# Patient Record
Sex: Male | Born: 1990 | Race: White | Hispanic: No | Marital: Single | Smoking: Current some day smoker
Health system: Southern US, Community
[De-identification: ages and names within clinical notes are randomized; demographics above are authoritative.]

## PROBLEM LIST (undated history)

## (undated) DIAGNOSIS — T7840XA Allergy, unspecified, initial encounter: Secondary | ICD-10-CM

## (undated) DIAGNOSIS — I10 Essential (primary) hypertension: Secondary | ICD-10-CM

## (undated) HISTORY — DX: Allergy, unspecified, initial encounter: T78.40XA

## (undated) HISTORY — DX: Essential (primary) hypertension: I10

---
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
## 2009-11-11 HISTORY — PX: ARTHROSCOPIC REPAIR ACL: SUR80

## 2012-06-27 ENCOUNTER — Ambulatory Visit (INDEPENDENT_AMBULATORY_CARE_PROVIDER_SITE_OTHER): Payer: BC Managed Care – PPO | Admitting: Emergency Medicine

## 2012-06-27 VITALS — BP 132/56 | HR 73 | Temp 98.3°F | Resp 16 | Ht 75.5 in | Wt 255.0 lb

## 2012-06-27 DIAGNOSIS — L508 Other urticaria: Secondary | ICD-10-CM

## 2012-06-27 MED ORDER — FEXOFENADINE HCL 180 MG PO TABS: 180.0000 mg | ORAL_TABLET | Freq: Every day | ORAL | Status: AC

## 2012-06-27 MED ORDER — CIMETIDINE 200 MG PO TABS: 400.0000 mg | ORAL_TABLET | Freq: Four times a day (QID) | ORAL | Status: DC

## 2012-06-27 MED ORDER — METHYLPREDNISOLONE ACETATE 80 MG/ML IJ SUSP
80.0000 mg | Freq: Once | INTRAMUSCULAR | Status: AC
  Administered 2012-06-27: 80 mg via INTRAMUSCULAR

## 2012-06-27 NOTE — Patient Instructions (Signed)
Hives Hives (urticaria) are itchy, red, swollen patches on the skin. They may change size, shape, and location quickly and repeatedly. Hives that occur deeper in the skin can cause swelling of the hands, feet, and face. Hives may be an allergic reaction to something you or your child ate, touched, or put on the skin. Hives can also be a reaction to cold, heat, viral infections, medication, insect bites, or emotional stress. Often the cause is hard to find. Hives can come and go for several days to several weeks. Hives are not contagious. HOME CARE INSTRUCTIONS   If the cause of the hives is known, avoid exposure to that source.   To relieve itching and rash:   Apply cold compresses to the skin or take cool water baths. Do not take or give your child hot baths or showers because the warmth will make the itching worse.   The best medicine for hives is an antihistamine. An antihistamine will not cure hives, but it will reduce their severity. You can use an antihistamine available over the counter. This medicine may make your child sleepy. Teenagers should not drive while using this medicine.   Take or give an antihistamine every 6 hours until the hives are completely gone for 24 hours or as directed.   Your child may have other medications prescribed for itching. Give these as directed by your child's caregiver.   You or your child should wear loose fitting clothing, including undergarments. Skin irritations may make hives worse.   Follow-up as directed by your caregiver.  SEEK MEDICAL CARE IF:   You or your child still have considerable itching after taking the medication (prescribed or purchased over the counter).   Joint swelling or pain occurs.  SEEK IMMEDIATE MEDICAL CARE IF:   You have a fever.   Swollen lips or tongue are noticed.   There is difficulty with breathing, swallowing, or tightness in the throat or chest.   Abdominal pain develops.   Your child starts acting very  sick.  These may be the first signs of a life-threatening allergic reaction. THIS IS AN EMERGENCY. Call 911 for medical help. MAKE SURE YOU:   Understand these instructions.   Will watch your condition.   Will get help right away if you are not doing well or get worse.  Document Released: 11/28/2005 Document Revised: 11/17/2011 Document Reviewed: 07/18/2008 Exit[employer] Patient Information 2012 [employer]. 

## 2012-06-27 NOTE — Progress Notes (Signed)
   Date:  06/27/2012   Name:  Stephen Leach   DOB:  [birth year]   MRN:  [id number]  PCP:  No primary provider on file.    Chief Complaint: Allergic Reaction   History of Present Illness:  Stephen Leach is a 21 y.o. very pleasant male patient who presents with the following:  Migratory urticaria over past several days.  No antecedent illness or exposure to new cosmetic or hygiene product or chemicals.  No medications.  No other complaints.  There is no problem list on file for this patient.   No past medical history on file.  No past surgical history on file.  History  Substance Use Topics  . Smoking status: Current Some Day Smoker  . Smokeless tobacco: Not on file  . Alcohol Use: Not on file    No family history on file.  No Known Allergies  Medication list has been reviewed and updated.  No current outpatient prescriptions on file prior to visit.    Review of Systems:  As per HPI, otherwise negative.    Physical Examination: Filed Vitals:   06/27/12 1625  BP: 132/56  Pulse: 73  Temp: 98.3 F (36.8 C)  Resp: 16   Filed Vitals:   06/27/12 1625  Height: 6' 3.5" (1.918 m)  Weight: 255 lb (115.667 kg)   Body mass index is 31.45 kg/(m^2). Ideal Body Weight: Weight in (lb) to have BMI = 25: 202.3   GEN: WDWN, NAD, Non-toxic, A & O x 3 HEENT: Atraumatic, Normocephalic. Neck supple. No masses, No LAD. Ears and Nose: No external deformity. CV: RRR, No M/G/R. No JVD. No thrill. No extra heart sounds. PULM: CTA B, no wheezes, crackles, rhonchi. No retractions. No resp. distress. No accessory muscle use. ABD: S, NT, ND, +BS. No rebound. No HSM. EXTR: No c/c/e NEURO Normal gait.  PSYCH: Normally interactive. Conversant. Not depressed or anxious appearing.  Calm demeanor.    Assessment and Plan: Claritin Zantac Depo Medrol   Allergic urticaria Follow up as needed  Carmelina Dane, MD

## 2013-09-12 ENCOUNTER — Ambulatory Visit (INDEPENDENT_AMBULATORY_CARE_PROVIDER_SITE_OTHER): Payer: BC Managed Care – PPO | Admitting: Physician Assistant

## 2013-09-12 DIAGNOSIS — H60391 Other infective otitis externa, right ear: Secondary | ICD-10-CM

## 2013-09-12 DIAGNOSIS — H60399 Other infective otitis externa, unspecified ear: Secondary | ICD-10-CM

## 2013-09-12 DIAGNOSIS — H6690 Otitis media, unspecified, unspecified ear: Secondary | ICD-10-CM

## 2013-09-12 MED ORDER — CIPROFLOXACIN-DEXAMETHASONE 0.3-0.1 % OT SUSP: 4.0000 [drp] | Freq: Two times a day (BID) | OTIC | Status: DC

## 2013-09-12 MED ORDER — AMOXICILLIN-POT CLAVULANATE 875-125 MG PO TABS: 1.0000 | ORAL_TABLET | Freq: Two times a day (BID) | ORAL | Status: DC

## 2013-09-12 NOTE — Progress Notes (Signed)
Patient ID: Stephen Leach MRN: [id number], DOB: 1991-07-22, 22 y.o. Date of Encounter: 09/12/2013, 12:50 PM  Primary Physician: No primary provider on file.  Chief Complaint: Water in right ear x 6 days  HPI: 22 y.o. male with history below presents with painful right ear x 6 days. 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 Notes a sensation of the ear feeling "cloudy." When asked to further describe this he associates it with muffled hearing. He has not been sick recently, although he does note some nasal congestion when he lays down to go to sleep. Afebrile. No drainage, discharge, or bleeding from the ears. He also notes a similar feeling along the left ear, although not as bad as the right ear. He tried some left over Neomycin/Polymyxin ear drops the past 2 days without success.   He also plans to really work on his weight in the upcoming future. He knows that he is overweight and that this puts him at increased risk later in life.    Past Medical History  Diagnosis Date  . Hypertension      Home Meds: Prior to Admission medications   Medication Sig Start Date End Date Taking? Authorizing Provider         cimetidine (TAGAMET HB) 200 MG tablet Take 2 tablets (400 mg total) by mouth 4 (four) times daily. 06/27/12 06/27/13  Phillips Odor, MD         fexofenadine (ALLEGRA) 180 MG tablet Take 1 tablet (180 mg total) by mouth daily. 06/27/12 06/27/13  Phillips Odor, MD    Allergies: No Known Allergies  History   Social History  . Marital Status: Single    Spouse Name: N/A    Number of Children: N/A  . Years of Education: N/A   Occupational History  . Not on file.   Social History Main Topics  . Smoking status: Current Some Day Smoker  . Smokeless tobacco: Not on file  . Alcohol Use: No  . Drug Use: No  . Sexual Activity: Not on file   Other Topics Concern  . Not on file   Social History Narrative  . No narrative on file     Review of  Systems: Constitutional: negative for chills, fever, or fatigue  HEENT: see above Cardiovascular: negative for chest pain or palpitations Respiratory: negative for wheezing, shortness of breath, or cough Abdominal: negative for abdominal pain, nausea, vomiting, or diarrhea Dermatological: negative for rash Neurologic: negative for headache   Physical Exam: Blood pressure 142/86, pulse 76, temperature 97.9 F (36.6 C), temperature source Oral, resp. rate 18, height 6' 5.5" (1.969 m), weight 265 lb (120.203 kg), SpO2 100.00%., Body mass index is 31 kg/(m^2). General: Well developed, well nourished, in no acute distress. Head: Normocephalic, atraumatic, eyes without discharge, sclera non-icteric, nares are without discharge. Left auditory canal clear, TM without perforation, pearly grey and translucent with reflective cone of light. Right auditory auditory canal mildly swollen with small amount of debris. Canal is patent. TM dull and retracted without perforation. Oral cavity moist, posterior pharynx without exudate, erythema, peritonsillar abscess, or post nasal drip. Uvula midline.   Neck: Supple. No thyromegaly. Full ROM. No lymphadenopathy. Lungs: Clear bilaterally to auscultation without wheezes, rales, or rhonchi. Breathing is unlabored. Heart: RRR with S1 S2. No murmurs, rubs, or gallops appreciated. Msk:  Strength and tone normal for age. Extremities/Skin: Warm and dry. No clubbing or cyanosis. No edema. No rashes or suspicious lesions. Neuro: Alert and oriented X  3. Moves all extremities spontaneously. Gait is normal. CNII-XII grossly in tact. Psych:  Responds to questions appropriately with a normal affect.     ASSESSMENT AND PLAN:  22 y.o. male with bilateral otitis externa, left otitis media, and obesity.  1) Bilateral otitis externa/otitis media   -Ciprodex 4 gtt bid #1 no RF  -Augmentin 875/125 mg 1 po bid #20 no RF -Wear ear plugs when swimming  2) Obesity -Discussed  need to improved healthy diet and exercise -Weight loss -Goal of 3-5 pounds per week  Signed, Eula Listen, PA-C [facility] [place], [place] [address] [phone] 09/12/2013 12:50 PM

## 2015-03-06 ENCOUNTER — Ambulatory Visit (INDEPENDENT_AMBULATORY_CARE_PROVIDER_SITE_OTHER): Payer: Managed Care, Other (non HMO) | Admitting: Family Medicine

## 2015-03-06 VITALS — BP 138/84 | HR 76 | Temp 97.6°F | Resp 16 | Ht 76.0 in | Wt 280.0 lb

## 2015-03-06 DIAGNOSIS — Z113 Encounter for screening for infections with a predominantly sexual mode of transmission: Secondary | ICD-10-CM | POA: Diagnosis not present

## 2015-03-06 DIAGNOSIS — Z7251 High risk heterosexual behavior: Secondary | ICD-10-CM | POA: Diagnosis not present

## 2015-03-06 NOTE — Patient Instructions (Signed)

## 2015-03-06 NOTE — Progress Notes (Signed)
    MRN: [id number] DOB: 1991-05-13  Subjective:   Stephen Leach is a 24 y.o. male presenting for chief complaint of STD Check  Reports that he would like to have an STI check. Had a girlfriend for last 3-4 years, had a break up, then had unprotected sex with another male. Since then, patient has had small lesions over upper lip and head of penis in the past 1.5 years. Denies fevers, pain, discharge or bleeding of lesions, penile discharge, inguinal pain, testicular pain, erythema or swelling, rashes. His previous girlfriend did not have an STI but he is unsure about the last sexual partner. Has 2 cigarettes per day, occasional alcohol drink. Denies any other aggravating or relieving factors, no other questions or concerns.  Stephen Leach has a current medication list which includes the following prescription(s): fexofenadine. He has No Known Allergies.  Stephen Leach  has a past medical history of Hypertension. Also  has past surgical history that includes Arthroscopic repair ACL (Left, 11/2009).  ROS As in subjective.  Objective:   Vitals: BP 138/84 mmHg  Pulse 76  Temp(Src) 97.6 F (36.4 C)  Resp 16  Ht 6\' 4"  (1.93 m)  Wt 280 lb (127.007 kg)  BMI 34.10 kg/m2  SpO2 99%  Physical Exam  Constitutional: He is oriented to person, place, and time and well-developed, well-nourished, and in no distress.  HENT:  Mouth/Throat: Oropharynx is clear and moist. Mucous membranes are not pale and not dry.    Cardiovascular: Normal rate.   Pulmonary/Chest: Effort normal.  Genitourinary: He exhibits no abnormal testicular mass, no testicular tenderness, no abnormal scrotal mass, no scrotal tenderness and no epididymal tenderness. Cremasteric reflex is present. Penis exhibits no lesions and no edema. No discharge found.  Neurological: He is alert and oriented to person, place, and time.  Skin: Skin is warm and dry. No rash noted. No erythema. No pallor.   Assessment and Plan :   1. High risk sexual  behavior 2. Encounter for screening examination for sexually transmitted disease - Labs pending, advised safe sex practices. Consider anxiety component to symptoms, possible referral to dermatology for further evaluation of upper lip concerns.   [name] Tatiyanna Lashley, PA-C [facility] [phone] 03/06/2015 12:35 PM

## 2015-03-06 NOTE — Progress Notes (Signed)
Discussed with ParsonsMani, Georgia[place]. I examined the patient also. He has some little hypopigmented areas on the center portion of his upper lip and little tiny spots. I inquired as to whether he was chilling his lip and he denies that. He thinks they're getting worse however. He also was concerned about a little of the beating this to the rim of the penis. This does not appear grossly abnormal. STD tests are pending today. I think he probably needs to see a dermatologist for the lip. The penis possible rash probably is insignificant. Discussed plan.

## 2015-03-07 ENCOUNTER — Telehealth: Payer: Self-pay | Admitting: Urgent Care

## 2015-03-07 ENCOUNTER — Telehealth: Payer: Self-pay

## 2015-03-07 LAB — GC/CHLAMYDIA PROBE AMP
CT Probe RNA: NEGATIVE
GC PROBE AMP APTIMA: NEGATIVE

## 2015-03-07 LAB — HIV ANTIBODY (ROUTINE TESTING W REFLEX): HIV: NONREACTIVE

## 2015-03-07 LAB — RPR

## 2015-03-07 NOTE — Telephone Encounter (Signed)
Returning call about lab resuts

## 2015-03-07 NOTE — Telephone Encounter (Signed)
Left voicemail with results, requested call back to discuss follow up and plan regarding lip lesions.  [name] Lyvia Mondesir, PA-C [facility] [phone] 03/07/2015  3:36 PM

## 2015-03-08 NOTE — Telephone Encounter (Signed)
Left VM, requested reliable time to call patient.  [name] Jestine Bicknell, PA-C [facility] [phone] 03/08/2015  12:16 PM

## 2015-03-08 NOTE — Telephone Encounter (Signed)
[facility]

## 2015-03-08 NOTE — Telephone Encounter (Signed)
Spoke with pt. Notified of labs. Will call back if he wants referral. Wants to talk with his father first.

## 2015-03-08 NOTE — Telephone Encounter (Signed)
Please report negative STI results to patient. Ask him if he would like to proceed with referral to dermatology for evaluation of his lip lesions.  Thank you! [name] Lacorey Brusca, PA-C [facility] [phone] 03/08/2015  1:59 PM

## 2015-06-12 ENCOUNTER — Ambulatory Visit (INDEPENDENT_AMBULATORY_CARE_PROVIDER_SITE_OTHER): Payer: Managed Care, Other (non HMO)

## 2015-06-12 ENCOUNTER — Ambulatory Visit (INDEPENDENT_AMBULATORY_CARE_PROVIDER_SITE_OTHER): Payer: Managed Care, Other (non HMO) | Admitting: Internal Medicine

## 2015-06-12 VITALS — BP 120/72 | HR 64 | Temp 97.8°F | Resp 18 | Ht 76.0 in | Wt 278.0 lb

## 2015-06-12 DIAGNOSIS — N390 Urinary tract infection, site not specified: Secondary | ICD-10-CM

## 2015-06-12 DIAGNOSIS — R062 Wheezing: Secondary | ICD-10-CM

## 2015-06-12 DIAGNOSIS — R300 Dysuria: Secondary | ICD-10-CM

## 2015-06-12 DIAGNOSIS — R059 Cough, unspecified: Secondary | ICD-10-CM

## 2015-06-12 DIAGNOSIS — R05 Cough: Secondary | ICD-10-CM | POA: Diagnosis not present

## 2015-06-12 DIAGNOSIS — R319 Hematuria, unspecified: Secondary | ICD-10-CM | POA: Diagnosis not present

## 2015-06-12 DIAGNOSIS — R8281 Pyuria: Secondary | ICD-10-CM

## 2015-06-12 LAB — COMPREHENSIVE METABOLIC PANEL
ALK PHOS: 86 U/L (ref 39–117)
ALT: 27 U/L (ref 0–53)
AST: 27 U/L (ref 0–37)
Albumin: 4.5 g/dL (ref 3.5–5.2)
BILIRUBIN TOTAL: 0.4 mg/dL (ref 0.2–1.2)
BUN: 13 mg/dL (ref 6–23)
CO2: 22 mEq/L (ref 19–32)
CREATININE: 0.91 mg/dL (ref 0.50–1.35)
Calcium: 9.5 mg/dL (ref 8.4–10.5)
Chloride: 104 mEq/L (ref 96–112)
Glucose, Bld: 94 mg/dL (ref 70–99)
POTASSIUM: 4.5 meq/L (ref 3.5–5.3)
Sodium: 139 mEq/L (ref 135–145)
Total Protein: 7.2 g/dL (ref 6.0–8.3)

## 2015-06-12 LAB — POCT UA - MICROSCOPIC ONLY
Bacteria, U Microscopic: NEGATIVE
CRYSTALS, UR, HPF, POC: NEGATIVE
Casts, Ur, LPF, POC: NEGATIVE
Epithelial cells, urine per micros: NEGATIVE
MUCUS UA: NEGATIVE
YEAST UA: NEGATIVE

## 2015-06-12 LAB — POCT CBC
Granulocyte percent: 63.2 %G (ref 37–80)
HEMATOCRIT: 46.8 % (ref 43.5–53.7)
Hemoglobin: 15 g/dL (ref 14.1–18.1)
Lymph, poc: 2.3 (ref 0.6–3.4)
MCH, POC: 28.5 pg (ref 27–31.2)
MCHC: 32 g/dL (ref 31.8–35.4)
MCV: 88.9 fL (ref 80–97)
MID (cbc): 0.4 (ref 0–0.9)
MPV: 7.8 fL (ref 0–99.8)
PLATELET COUNT, POC: 218 10*3/uL (ref 142–424)
POC GRANULOCYTE: 4.6 (ref 2–6.9)
POC LYMPH %: 31 % (ref 10–50)
POC MID %: 5.8 % (ref 0–12)
RBC: 5.27 M/uL (ref 4.69–6.13)
RDW, POC: 13.6 %
WBC: 7.3 10*3/uL (ref 4.6–10.2)

## 2015-06-12 LAB — POCT URINALYSIS DIPSTICK
Glucose, UA: NEGATIVE
NITRITE UA: POSITIVE
Protein, UA: 100
Spec Grav, UA: 1.015
Urobilinogen, UA: 1
pH, UA: 5

## 2015-06-12 MED ORDER — PHENAZOPYRIDINE HCL 200 MG PO TABS: 200.0000 mg | ORAL_TABLET | Freq: Three times a day (TID) | ORAL | Status: AC | PRN

## 2015-06-12 MED ORDER — SULFAMETHOXAZOLE-TRIMETHOPRIM 800-160 MG PO TABS: 1.0000 | ORAL_TABLET | Freq: Two times a day (BID) | ORAL | Status: AC

## 2015-06-12 NOTE — Patient Instructions (Signed)
Follow-up for repeat urinalysis in 3-4 weeks to prove that this infection has cleared If wheezing continues we can add an inhaler but go ahead and stop smoking

## 2015-06-12 NOTE — Progress Notes (Signed)
Subjective:  This chart was scribed for Stephen Sia, MD by Stephen Leach, Medical Scribe. This patient was seen in Room 12 and the patient's care was started at 10:25 AM.     Patient ID: Stephen Leach, male    DOB: 1991-10-13, 24 y.o.   MRN: [id number]  HPI Stephen Leach is a 24 y.o. male who presents to [facility] complaining of dysuria rating 7/10 pain with associated hematuria starting last week. He noticed hematuria starting last week but there was noticeably more this morning. His last sexual contact was receiving oral sex within this week, and another several months ago. He notes having some coughs that wakes him up at night with some phlegm. He denies nocturia, frequency, flank pain, nausea, fever, chills, diaphoresis, h/o kidney problems, testicular pain. He also denies any h/o asthma.   There are no active problems to display for this patient.   Current outpatient prescriptions:  .  fexofenadine (ALLEGRA) 180 MG tablet, Take 1 tablet (180 mg total) by mouth daily., Disp: 30 tablet, Rfl: 0  Review of Systems  Constitutional: Negative for fever, chills and diaphoresis.  Respiratory: Positive for cough and wheezing.   Gastrointestinal: Negative for nausea.  Genitourinary: Positive for dysuria and hematuria. Negative for urgency, frequency, flank pain, discharge and testicular pain.       Objective:   Physical Exam  Constitutional: He is oriented to person, place, and time. He appears well-developed and well-nourished. No distress.  HENT:  Head: Normocephalic and atraumatic.  Eyes: EOM are normal. Pupils are equal, round, and reactive to light.  Neck: Neck supple.  Cardiovascular: Normal rate, regular rhythm and normal heart sounds.   Pulmonary/Chest: Effort normal and breath sounds normal. No respiratory distress.  Abdominal: There is no tenderness. There is no rebound.  Musculoskeletal: Normal range of motion. He exhibits no edema.  Neurological: He is alert and  oriented to person, place, and time.  Skin: Skin is warm and dry.  Psychiatric: He has a normal mood and affect. His behavior is normal.  Nursing note and vitals reviewed. BP 120/72 mmHg  Pulse 64  Temp(Src) 97.8 F (36.6 C) (Oral)  Resp 18  Ht  (1.93 m)  Wt 278 lb (126.1 kg)  BMI 33.85 kg/m2  SpO2 98%  Results for orders placed or performed in visit on 06/12/15  Comprehensive metabolic panel  Result Value Ref Range   Sodium 139 135 - 145 mEq/L   Potassium 4.5 3.5 - 5.3 mEq/L   Chloride 104 96 - 112 mEq/L   CO2 22 19 - 32 mEq/L   Glucose, Bld 94 70 - 99 mg/dL   BUN 13 6 - 23 mg/dL   Creat 4.09 8.11 - 9.14 mg/dL   Total Bilirubin 0.4 0.2 - 1.2 mg/dL   Alkaline Phosphatase 86 39 - 117 U/L   AST 27 0 - 37 U/L   ALT 27 0 - 53 U/L   Total Protein 7.2 6.0 - 8.3 g/dL   Albumin 4.5 3.5 - 5.2 g/dL   Calcium 9.5 8.4 - 78.2 mg/dL  POCT CBC  Result Value Ref Range   WBC 7.3 4.6 - 10.2 K/uL   Lymph, poc 2.3 0.6 - 3.4   POC LYMPH PERCENT 31.0 10 - 50 %L   MID (cbc) 0.4 0 - 0.9   POC MID % 5.8 0 - 12 %M   POC Granulocyte 4.6 2 - 6.9   Granulocyte percent 63.2 37 - 80 %G   RBC 5.27 4.69 -  6.13 M/uL   Hemoglobin 15.0 14.1 - 18.1 g/dL   HCT, POC 09.846.8 11.943.5 - 53.7 %   MCV 88.9 80 - 97 fL   MCH, POC 28.5 27 - 31.2 pg   MCHC 32.0 31.8 - 35.4 g/dL   RDW, POC 14.713.6 %   Platelet Count, POC 218 142 - 424 K/uL   MPV 7.8 0 - 99.8 fL  POCT UA - Microscopic Only  Result Value Ref Range   WBC, Ur, HPF, POC TNTC    RBC, urine, microscopic TNTC    Bacteria, U Microscopic NEG    Mucus, UA NEG    Epithelial cells, urine per micros NEG    Crystals, Ur, HPF, POC NEG    Casts, Ur, LPF, POC NEG    Yeast, UA NEG   POCT urinalysis dipstick  Result Value Ref Range   Color, UA RED    Clarity, UA CLOUDY    Glucose, UA NEG    Bilirubin, UA SMALL    Ketones, UA TRACE    Spec Grav, UA 1.015    Blood, UA LARGE    pH, UA 5.0    Protein, UA 100    Urobilinogen, UA 1.0    Nitrite, UA POSITIVE     Leukocytes, UA large (3+) (A) Negative      [facility] reading (PRIMARY) by  Dr. Dooli=NAD      Assessment & Plan:   I have completed the patient encounter in its entirety as documented by the scribe, with editing by me where necessary. Stephen Leach P. Merla Richesoolittle, M.D. } Hematuria - Plan: POCT CBC, POCT UA - Microscopic Only, POCT urinalysis dipstick, Comprehensive metabolic panel  Dysuria - Plan: GC/Chlamydia Probe Amp  Cough - Plan: DG Chest 2 View Wheezing - Plan: DG Chest 2 View  Stop smoking!!!!!  Pyuria - Plan: Urine culture  Meds ordered this encounter  Medications  . phenazopyridine (PYRIDIUM) 200 MG tablet    Sig: Take 1 tablet (200 mg total) by mouth 3 (three) times daily as needed for pain. Urinary pain    Dispense:  6 tablet    Refill:  0  . sulfamethoxazole-trimethoprim (BACTRIM DS,SEPTRA DS) 800-160 MG per tablet    Sig: Take 1 tablet by mouth 2 (two) times daily. For 10 days    Dispense:  20 tablet    Refill:  0   Cult pending uriprobe as well Patient Instructions  Follow-up for repeat urinalysis in 3-4 weeks to prove that this infection has cleared If wheezing continues we can add an inhaler but go ahead and stop smoking

## 2015-06-13 LAB — URINE CULTURE
Colony Count: NO GROWTH
ORGANISM ID, BACTERIA: NO GROWTH

## 2015-06-18 LAB — GC/CHLAMYDIA PROBE AMP
CT PROBE, AMP APTIMA: NEGATIVE
GC Probe RNA: NEGATIVE

## 2016-10-16 IMAGING — CR DG CHEST 2V
4 series · 4 of 4 positions shown · non-contrast
Comparison: None.

CLINICAL DATA: Shortness of breath, cough, wheezing.

EXAM:
CHEST  2 VIEW

[PA (1 of 2)]
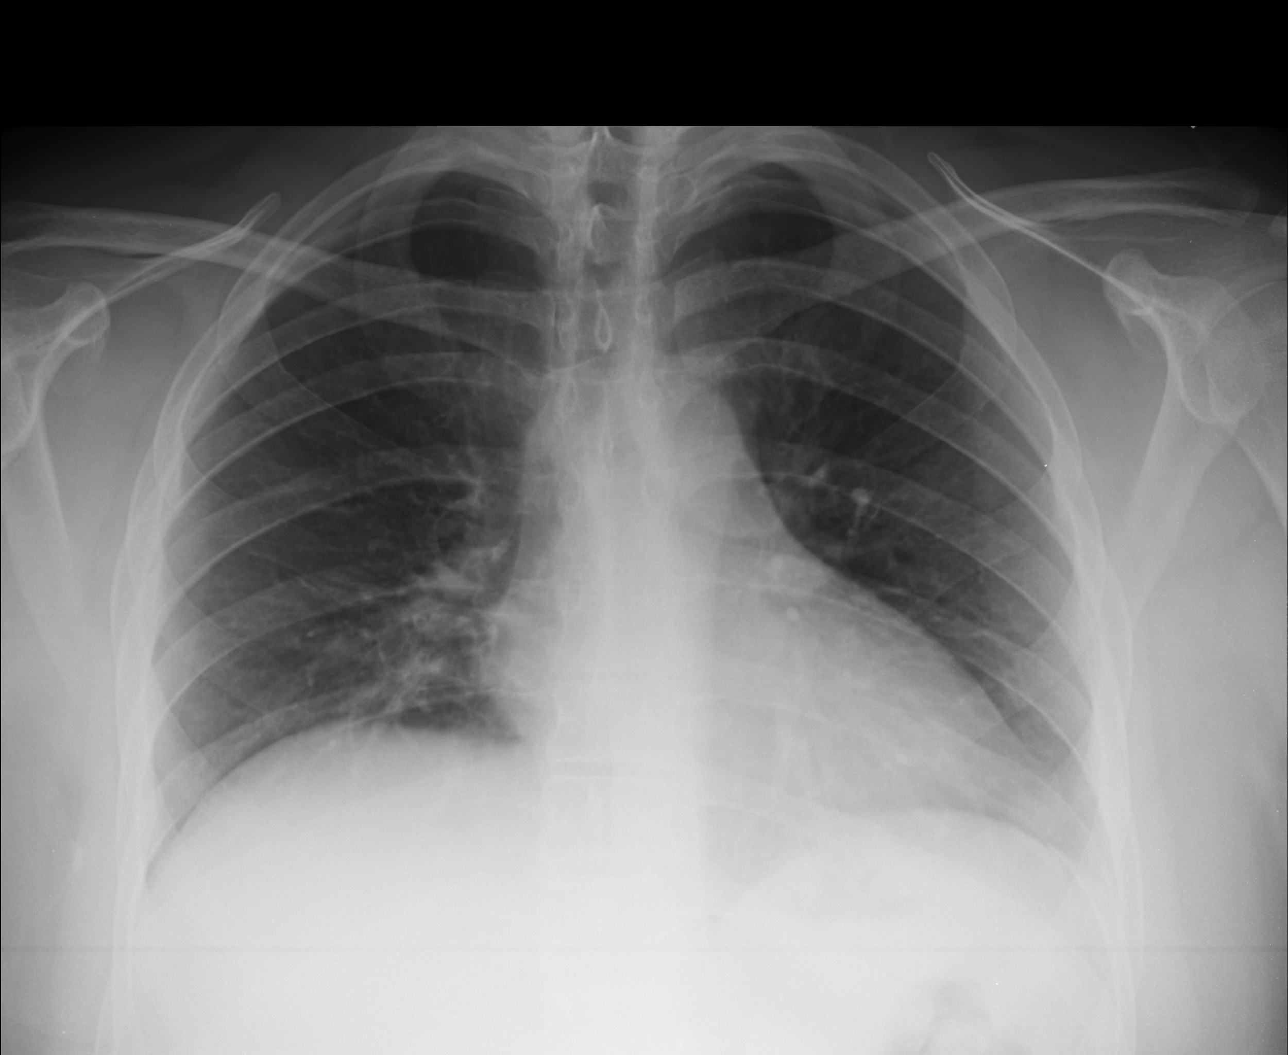

[lateral (1 of 2)]
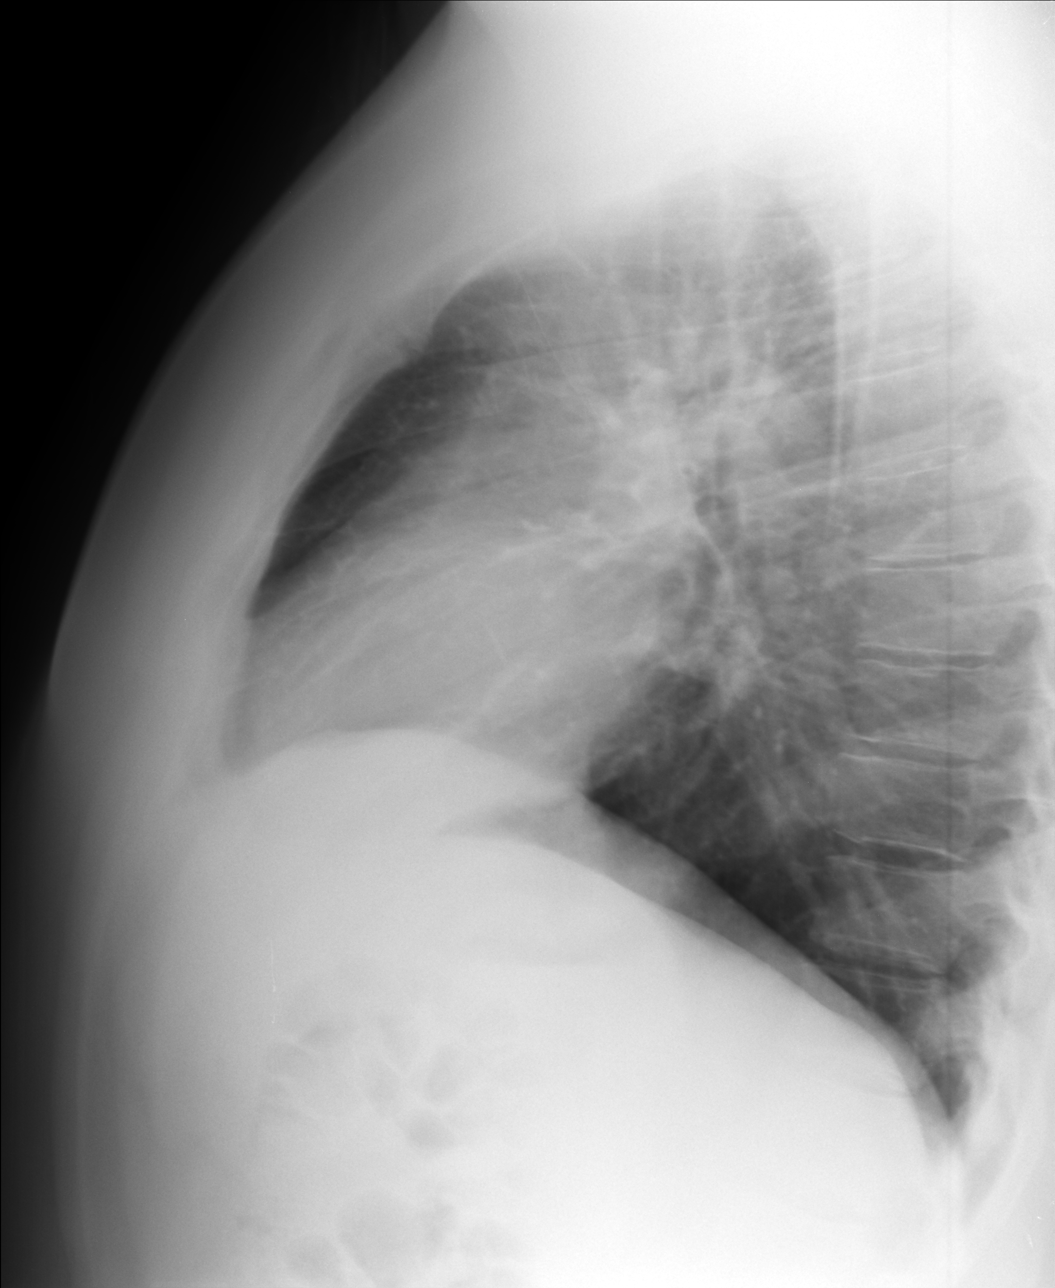

[PA (2 of 2)]
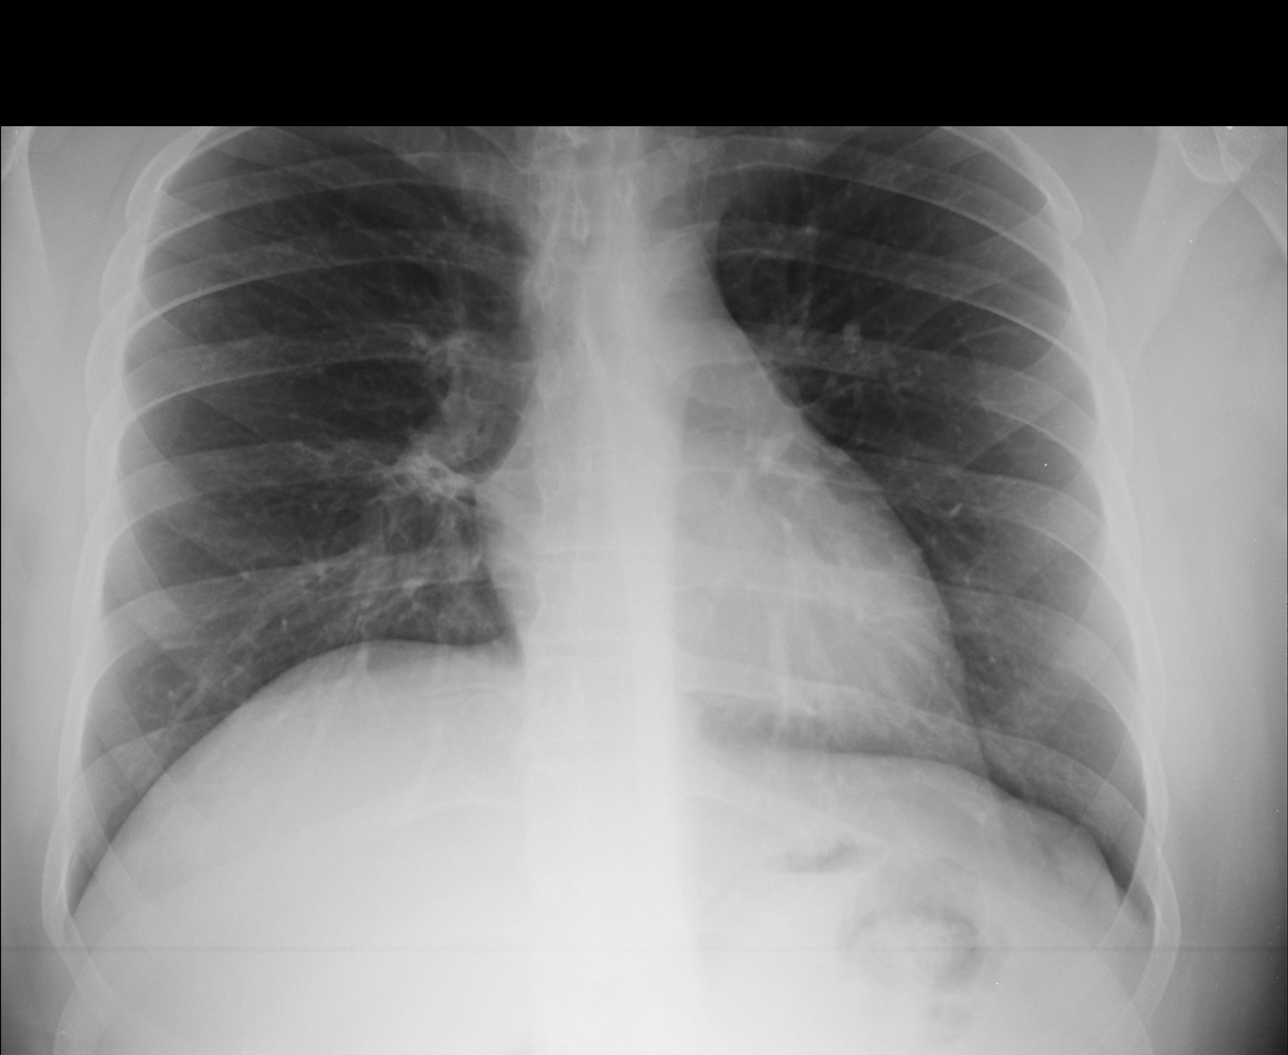

[lateral (2 of 2)]
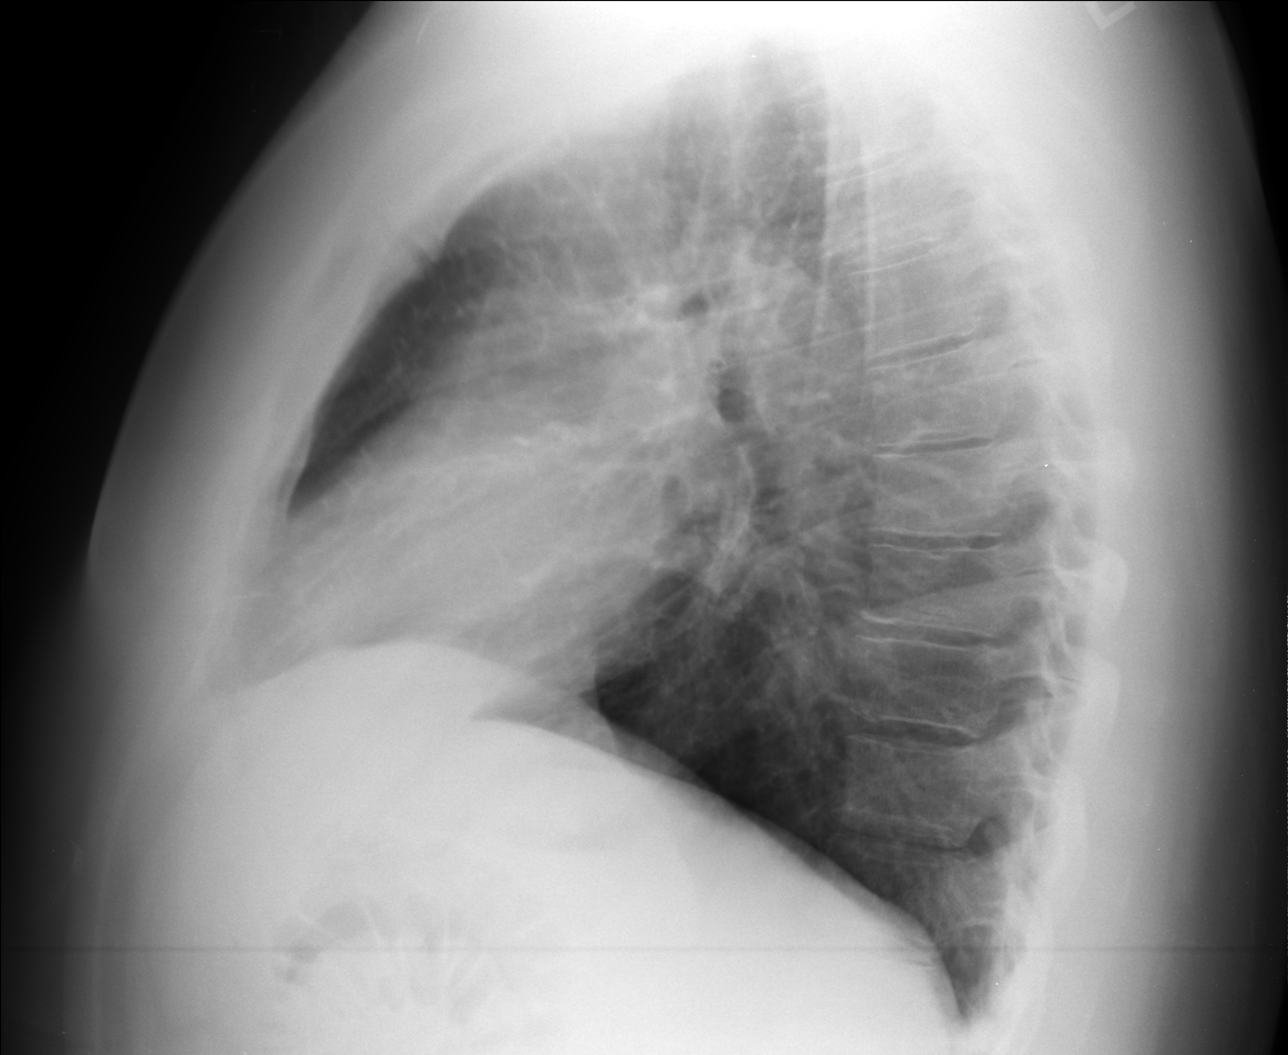

[4 of 4 positions shown; findings below may reference images not displayed]

FINDINGS: Heart is normal size. No confluent airspace opacities or effusions.
Mild central peribronchial thickening. No acute bony abnormality.
IMPRESSION: Mild bronchitic changes.
